# Patient Record
Sex: Male | Born: 1985 | Race: Black or African American | Hispanic: No | Marital: Single | Smoking: Never smoker
Health system: Southern US, Community
[De-identification: ages and names within clinical notes are randomized; demographics above are authoritative.]

## PROBLEM LIST (undated history)

## (undated) DIAGNOSIS — Q249 Congenital malformation of heart, unspecified: Secondary | ICD-10-CM

## (undated) HISTORY — PX: CARDIAC ELECTROPHYSIOLOGY MAPPING AND ABLATION: SHX1292

---

## 2009-02-28 ENCOUNTER — Emergency Department (HOSPITAL_COMMUNITY): Admission: EM | Admit: 2009-02-28 | Discharge: 2009-03-01 | Payer: Self-pay | Admitting: Emergency Medicine

## 2009-12-03 ENCOUNTER — Emergency Department (HOSPITAL_COMMUNITY): Admission: EM | Admit: 2009-12-03 | Discharge: 2009-12-03 | Payer: Self-pay | Admitting: Emergency Medicine

## 2011-07-10 ENCOUNTER — Emergency Department (HOSPITAL_COMMUNITY)
Admission: EM | Admit: 2011-07-10 | Discharge: 2011-07-11 | Disposition: A | Discharge status: Home / Self Care | Payer: BC Managed Care – PPO | Attending: Emergency Medicine | Admitting: Emergency Medicine

## 2011-07-10 DIAGNOSIS — R071 Chest pain on breathing: Secondary | ICD-10-CM | POA: Insufficient documentation

## 2011-07-10 DIAGNOSIS — R079 Chest pain, unspecified: Secondary | ICD-10-CM | POA: Insufficient documentation

## 2011-07-10 DIAGNOSIS — R0602 Shortness of breath: Secondary | ICD-10-CM | POA: Insufficient documentation

## 2011-07-10 DIAGNOSIS — N289 Disorder of kidney and ureter, unspecified: Secondary | ICD-10-CM

## 2011-07-10 DIAGNOSIS — R0789 Other chest pain: Secondary | ICD-10-CM

## 2011-07-10 HISTORY — DX: Congenital malformation of heart, unspecified: Q24.9

## 2011-07-10 NOTE — ED Notes (Signed)
Pt presents with NAD- left sided chest pain that started mid day- Pt is not constant increases with movement and does not change with breathing- Pt has HX of cardiac ablation.

## 2011-07-11 ENCOUNTER — Emergency Department (HOSPITAL_COMMUNITY): Payer: BC Managed Care – PPO

## 2011-07-11 LAB — BASIC METABOLIC PANEL
BUN: 14 mg/dL (ref 6–23)
CO2: 27 mEq/L (ref 19–32)
Calcium: 9 mg/dL (ref 8.4–10.5)
Chloride: 102 mEq/L (ref 96–112)
Chloride: 105 mEq/L (ref 96–112)
Potassium: 3.8 mEq/L (ref 3.5–5.1)
Sodium: 139 mEq/L (ref 135–145)

## 2011-07-11 LAB — CBC
Hemoglobin: 14.5 g/dL (ref 13.0–17.0)
MCH: 27.6 pg (ref 26.0–34.0)
MCHC: 32.7 g/dL (ref 30.0–36.0)
MCV: 84.6 fL (ref 78.0–100.0)
Platelets: 179 10*3/uL (ref 150–400)
RBC: 5.25 MIL/uL (ref 4.22–5.81)
RDW: 13.2 % (ref 11.5–15.5)

## 2011-07-11 LAB — CARDIAC PANEL(CRET KIN+CKTOT+MB+TROPI)
CK, MB: 2.1 ng/mL (ref 0.3–4.0)
Relative Index: 0.5 (ref 0.0–2.5)
Troponin I: <0.3 ng/mL (ref <0.30)

## 2011-07-11 LAB — CK: Total CK: 353 U/L — ABNORMAL HIGH (ref 7–232)

## 2011-07-11 MED ORDER — SODIUM CHLORIDE 0.9 % IV BOLUS (SEPSIS)
1000.0000 mL | Freq: Once | INTRAVENOUS | Status: AC
  Administered 2011-07-11: 1000 mL via INTRAVENOUS

## 2011-07-11 NOTE — ED Notes (Signed)
Liter bolus complete

## 2011-07-11 NOTE — ED Notes (Signed)
Labs redrawn and sent per rn

## 2011-07-11 NOTE — ED Notes (Signed)
Finished bolus of 1L of nacl

## 2011-07-11 NOTE — ED Notes (Signed)
Noted several bursts of st to 120 then with return to BL

## 2011-07-11 NOTE — ED Provider Notes (Signed)
History     CSN: [id number] Arrival date & time: 07/10/2011 11:55 PM   First MD Initiated Contact with Patient 07/11/11 0004      Chief Complaint  Patient presents with  . Chest Pain    (Consider location/radiation/quality/duration/timing/severity/associated sxs/prior treatment) HPI The patient presents with left sided chest pain. The pain began gradually approximately 12 hours ago. Since onset the pain has been more present than not, though it is waxing and waning. The pain is described as pressure-like. It is nonradiating. It is positional. It is not pleuritic. It is not exertional. No dyspnea, no lightheadedness, no nausea, no syncope. The patient notes that over the past 2-3 weeks he has had intermittent, brief episodes of lightheadedness, palpitations, mild nausea. The patient is a notable history of cardiac ablation as a teenager for an arrhythmia. He specifies that he did not have hypertrophic cardiomyopathy, and solely electrocardiographic abnormalities.  No current meds usage. Past Medical History  Diagnosis Date  . Heart abnormality     Past Surgical History  Procedure Date  . Cardiac electrophysiology mapping and ablation     History reviewed. No pertinent family history.  History  Substance Use Topics  . Smoking status: Never Smoker   . Smokeless tobacco: Not on file  . Alcohol Use: No      Review of Systems  All other systems reviewed and are negative.    Allergies  Review of patient's allergies indicates no known allergies.  Home Medications   Current Outpatient Rx  Name Route Sig Dispense Refill  . ASPIRIN 81 MG PO CHEW Oral Chew 81 mg by mouth daily.      Marland Kitchen DIMENHYDRINATE 50 MG PO TABS Oral Take 50 mg by mouth every 8 (eight) hours as needed. Dizziness lethargic feelings.     Marland Kitchen VITAMIN B-12 500 MCG PO TABS Oral Take 500 mcg by mouth daily.        BP 136/73  Pulse 67  Temp(Src) 98.4 F (36.9 C) (Oral)  Resp 18  SpO2 100%  Physical Exam    Constitutional: He is oriented to person, place, and time. He appears well-developed and well-nourished.  HENT:  Head: Normocephalic and atraumatic.  Eyes: Conjunctivae are normal. Pupils are equal, round, and reactive to light.  Neck: Neck supple.  Cardiovascular: Normal rate, regular rhythm, normal heart sounds and intact distal pulses.  Exam reveals no gallop and no friction rub.   No murmur heard. Pulses:      Radial pulses are 2+ on the right side, and 2+ on the left side.       Posterior tibial pulses are 2+ on the right side, and 2+ on the left side.  Pulmonary/Chest: No respiratory distress.  Abdominal: Soft. There is no tenderness.  Musculoskeletal: He exhibits no edema.  Neurological: He is alert and oriented to person, place, and time.  Skin: Skin is warm and dry.  Psychiatric: He has a normal mood and affect.    ED Course  Procedures (including critical care time)   Labs Reviewed  CBC  CARDIAC PANEL(CRET KIN+CKTOT+MB+TROPI)  BASIC METABOLIC PANEL   No results found.   No diagnosis found.    MDM  This previously well 25 year old male presents with one day of chest discomfort, which began following several weeks of intermittent, transient complaints. On exam the patient is in no distress. The patient's history of arrhythmia requiring intubation he is suggestive of dysrhythmia as possible etiology. The patient's ECG does not demonstrate dysrhythmia though there is  evidence of left ventricular hypertrophy. The absence of cardiomegaly on x-ray, as well as the patient's denial of prior cardiomyopathy in the absence of any syncope during this illness his reassuring for the very low probability of Cardiomyopathic disease. The patient's labs were notable for an elevated CK as well as an elevated creatinine. The initial labs were discussed with the patient who acknowledged using muscle building supplements in the distant past, including creatine.  Although this is a possible  cause of his lab abnormalities, or some concern for ongoing renal dysfunction.  The patient was resuscitated with IV fluids, and repeat labs demonstrated an improvement numerically. Discussion was held with the patient and his wife regarding the necessity for continued outpatient evaluation of these abnormalities. Return precautions were provided to the patient.      Gerhard Munch, MD 07/11/11 [phone]

## 2011-07-11 NOTE — ED Notes (Signed)
sts that all the CP /"strange feeling" has resolved

## 2011-07-19 ENCOUNTER — Encounter (HOSPITAL_COMMUNITY): Payer: Self-pay | Admitting: *Deleted

## 2011-07-19 ENCOUNTER — Emergency Department (HOSPITAL_COMMUNITY)
Admission: EM | Admit: 2011-07-19 | Discharge: 2011-07-20 | Disposition: A | Discharge status: Home / Self Care | Payer: BC Managed Care – PPO | Attending: Emergency Medicine | Admitting: Emergency Medicine

## 2011-07-19 ENCOUNTER — Emergency Department (HOSPITAL_COMMUNITY): Payer: BC Managed Care – PPO

## 2011-07-19 ENCOUNTER — Other Ambulatory Visit: Payer: Self-pay

## 2011-07-19 DIAGNOSIS — R079 Chest pain, unspecified: Secondary | ICD-10-CM | POA: Insufficient documentation

## 2011-07-19 DIAGNOSIS — R209 Unspecified disturbances of skin sensation: Secondary | ICD-10-CM | POA: Insufficient documentation

## 2011-07-19 DIAGNOSIS — R0602 Shortness of breath: Secondary | ICD-10-CM | POA: Insufficient documentation

## 2011-07-19 DIAGNOSIS — R0789 Other chest pain: Secondary | ICD-10-CM

## 2011-07-20 NOTE — ED Provider Notes (Signed)
History     CSN: [id number] Arrival date & time: 07/19/2011  7:57 PM   First MD Initiated Contact with Patient 07/20/11 0053      Chief Complaint  Patient presents with  . Chest Pain    pt c/o chest-ache, reports feeling anxious/nervous. states pain started today. and c/o left arm numbness.     (Consider location/radiation/quality/duration/timing/severity/associated sxs/prior treatment) HPI Comments: The patient presents with left sided chest pain that radiated to L arm for a short period  The pain began gradually approximately 6 hours ago. Since onset the pain has resolved. . The pain is described as pressure-like.  It is positional. It is not pleuritic. It is not exertional. No dyspnea, no lightheadedness, no nausea, no syncope. The patient notes that over the past 2-3 weeks he has had intermittent, brief episodes of lightheadedness, palpitations, mild nausea. The patient is a notable history of cardiac ablation as a teenager for an arrhythmia. He specifies that he did not have hypertrophic cardiomyopathy, and solely electrocardiographic abnormalities.  No current meds usage. Reports that today.  He did make an appointment with cardiology for further evaluation   Patient is a 25 y.o. male presenting with chest pain. The history is provided by the patient.  Chest Pain Chest pain occurs intermittently. The chest pain is resolved. The quality of the pain is described as aching. Pertinent negatives for primary symptoms include no palpitations.     Past Medical History  Diagnosis Date  . Heart abnormality a- fib    Past Surgical History  Procedure Date  . Cardiac electrophysiology mapping and ablation     History reviewed. No pertinent family history.  History  Substance Use Topics  . Smoking status: Never Smoker   . Smokeless tobacco: Not on file  . Alcohol Use: Yes     rarely      Review of Systems  Constitutional: Negative.   HENT: Negative.   Respiratory:  Negative for chest tightness.   Cardiovascular: Positive for chest pain. Negative for palpitations.  Gastrointestinal: Negative.   Genitourinary: Negative.   Musculoskeletal: Negative.   Neurological: Negative.   Hematological: Negative.   Psychiatric/Behavioral: Negative.     Allergies  Review of patient's allergies indicates no known allergies.  Home Medications   Current Outpatient Rx  Name Route Sig Dispense Refill  . ASPIRIN 81 MG PO CHEW Oral Chew 81 mg by mouth daily.      Marland Kitchen DIMENHYDRINATE 50 MG PO TABS Oral Take 50 mg by mouth every 8 (eight) hours as needed. Dizziness lethargic feelings.     Marland Kitchen VITAMIN B-12 500 MCG PO TABS Oral Take 500 mcg by mouth daily.        BP 133/93  Pulse 80  Temp(Src) 98.1 F (36.7 C) (Oral)  Resp 20  Wt 206 lb (93.441 kg)  SpO2 100%  Physical Exam  Constitutional: He is oriented to person, place, and time. He appears well-developed and well-nourished.  HENT:  Head: Normocephalic.  Eyes: Pupils are equal, round, and reactive to light.  Neck: Normal range of motion.  Cardiovascular: Normal rate.   Pulmonary/Chest: Effort normal.  Abdominal: Soft.  Musculoskeletal: Normal range of motion.  Neurological: He is oriented to person, place, and time.  Skin: Skin is warm.  Psychiatric: He has a normal mood and affect.    ED Course  Procedures (including critical care time)  Labs Reviewed - No data to display Dg Chest 2 View  07/19/2011  *RADIOLOGY REPORT*  Clinical Data: Chest  pain, shortness of breath, left arm numbness  CHEST - 2 VIEW  Comparison: 07/11/2011  Findings: Mild hyperinflation. The heart size and pulmonary vascularity are normal. The lungs appear clear and expanded without focal air space disease or consolidation. No blunting of the costophrenic angles. No pneumothorax.  No significant change since previous study.  IMPRESSION: No evidence of active pulmonary disease.  Original Report Authenticated By: Marlon Pel, M.D.     ED ECG REPORT   Date: 07/20/2011  EKG Time: 3:55 AM  Rate: 82  Rhythm: normal sinus rhythm,  normal EKG, normal sinus rhythm, unchanged from previous tracings  Axis: normal  Intervals:none  ST&T Change: none  Narrative Interpretation: normal            1. Chest pain radiating to arm       MDM  Chest pain unspecified        Arman Filter, NP 07/20/11 0355  Arman Filter, NP 07/20/11 [phone]

## 2011-07-20 NOTE — ED Provider Notes (Signed)
Medical screening examination/treatment/procedure(s) were performed by non-physician practitioner and as supervising physician I was immediately available for consultation/collaboration.   Lyanne Co, MD 07/20/11 [phone]

## 2011-07-20 NOTE — ED Notes (Signed)
Family at bs.

## 2013-06-17 IMAGING — CR DG CHEST 2V
2 series · 2 of 2 positions shown · non-contrast
Comparison: Chest radiograph performed 12/03/2009

CLINICAL DATA: Left-sided chest pain and shortness of breath;
syncope.

CHEST - 2 VIEW

[w chest pa]
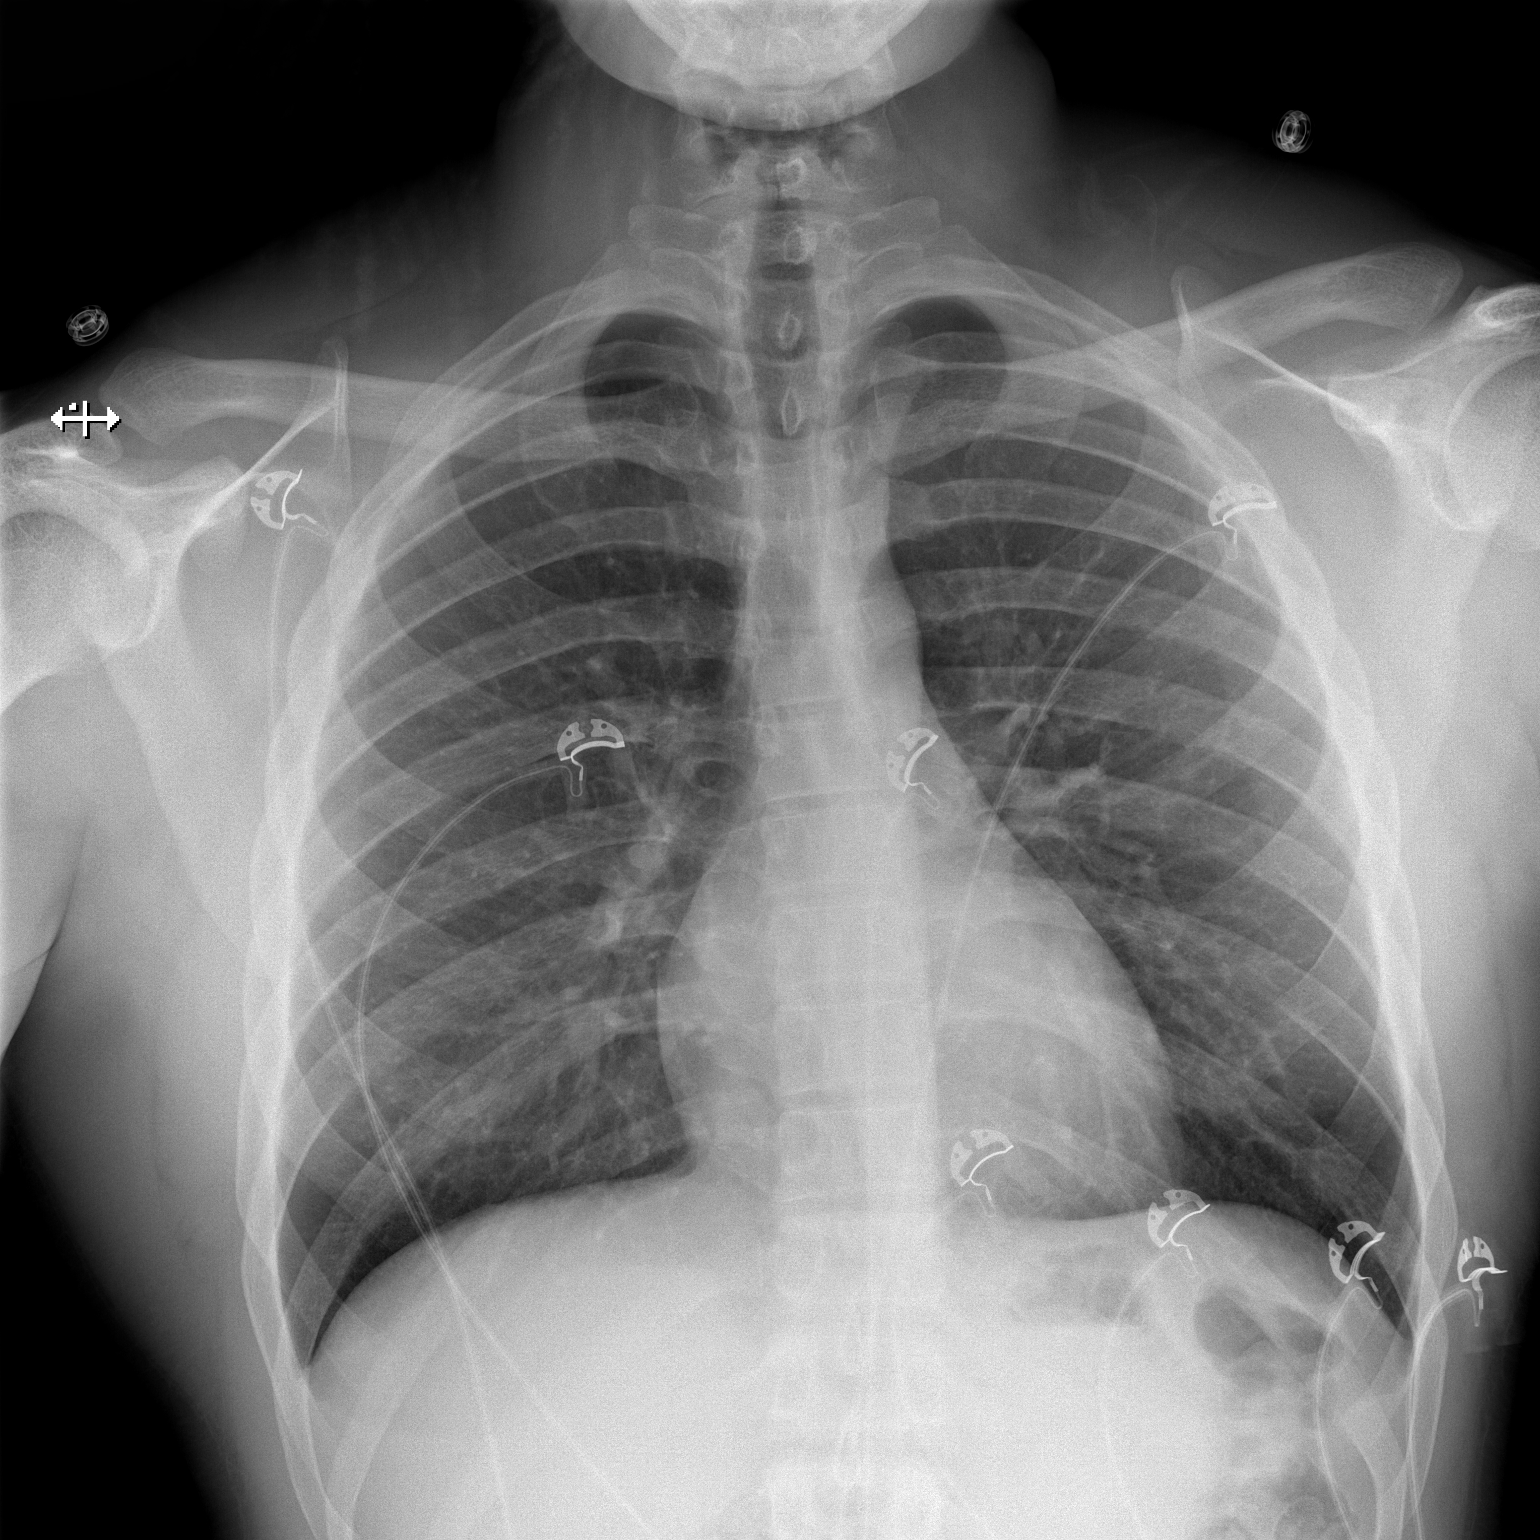

[w chest lat]
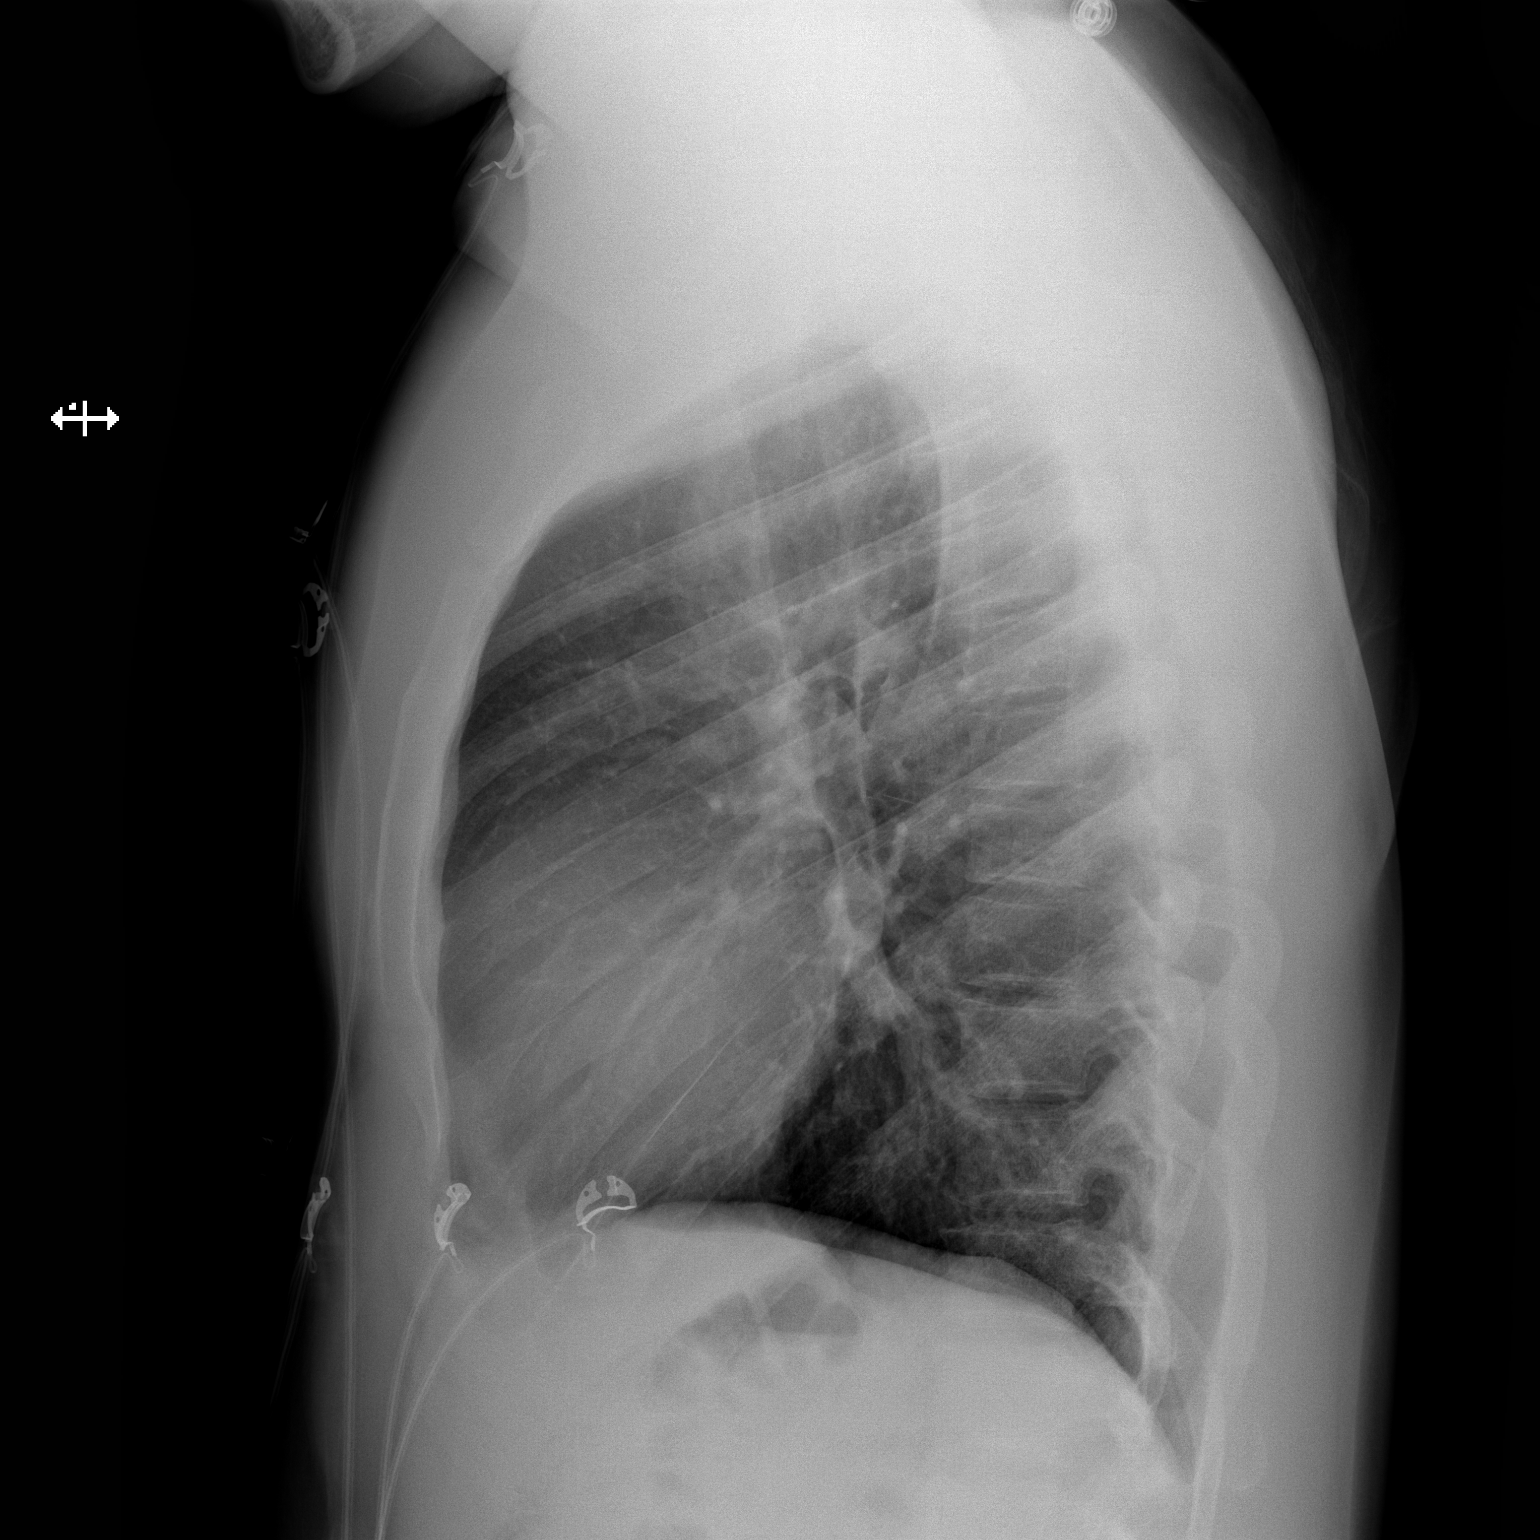

[2 of 2 positions shown; findings below may reference images not displayed]

FINDINGS: The lungs are well-aerated and clear.  There is no
evidence of focal opacification, pleural effusion or pneumothorax.

The heart is normal in size; the mediastinal contour is within
normal limits.  No acute osseous abnormalities are seen.
IMPRESSION: No acute cardiopulmonary process seen.

## 2013-06-25 IMAGING — CR DG CHEST 2V
2 series · 2 of 2 positions shown · non-contrast
Comparison: 07/11/2011

CLINICAL DATA: Chest pain, shortness of breath, left arm numbness

CHEST - 2 VIEW

[w chest pa]
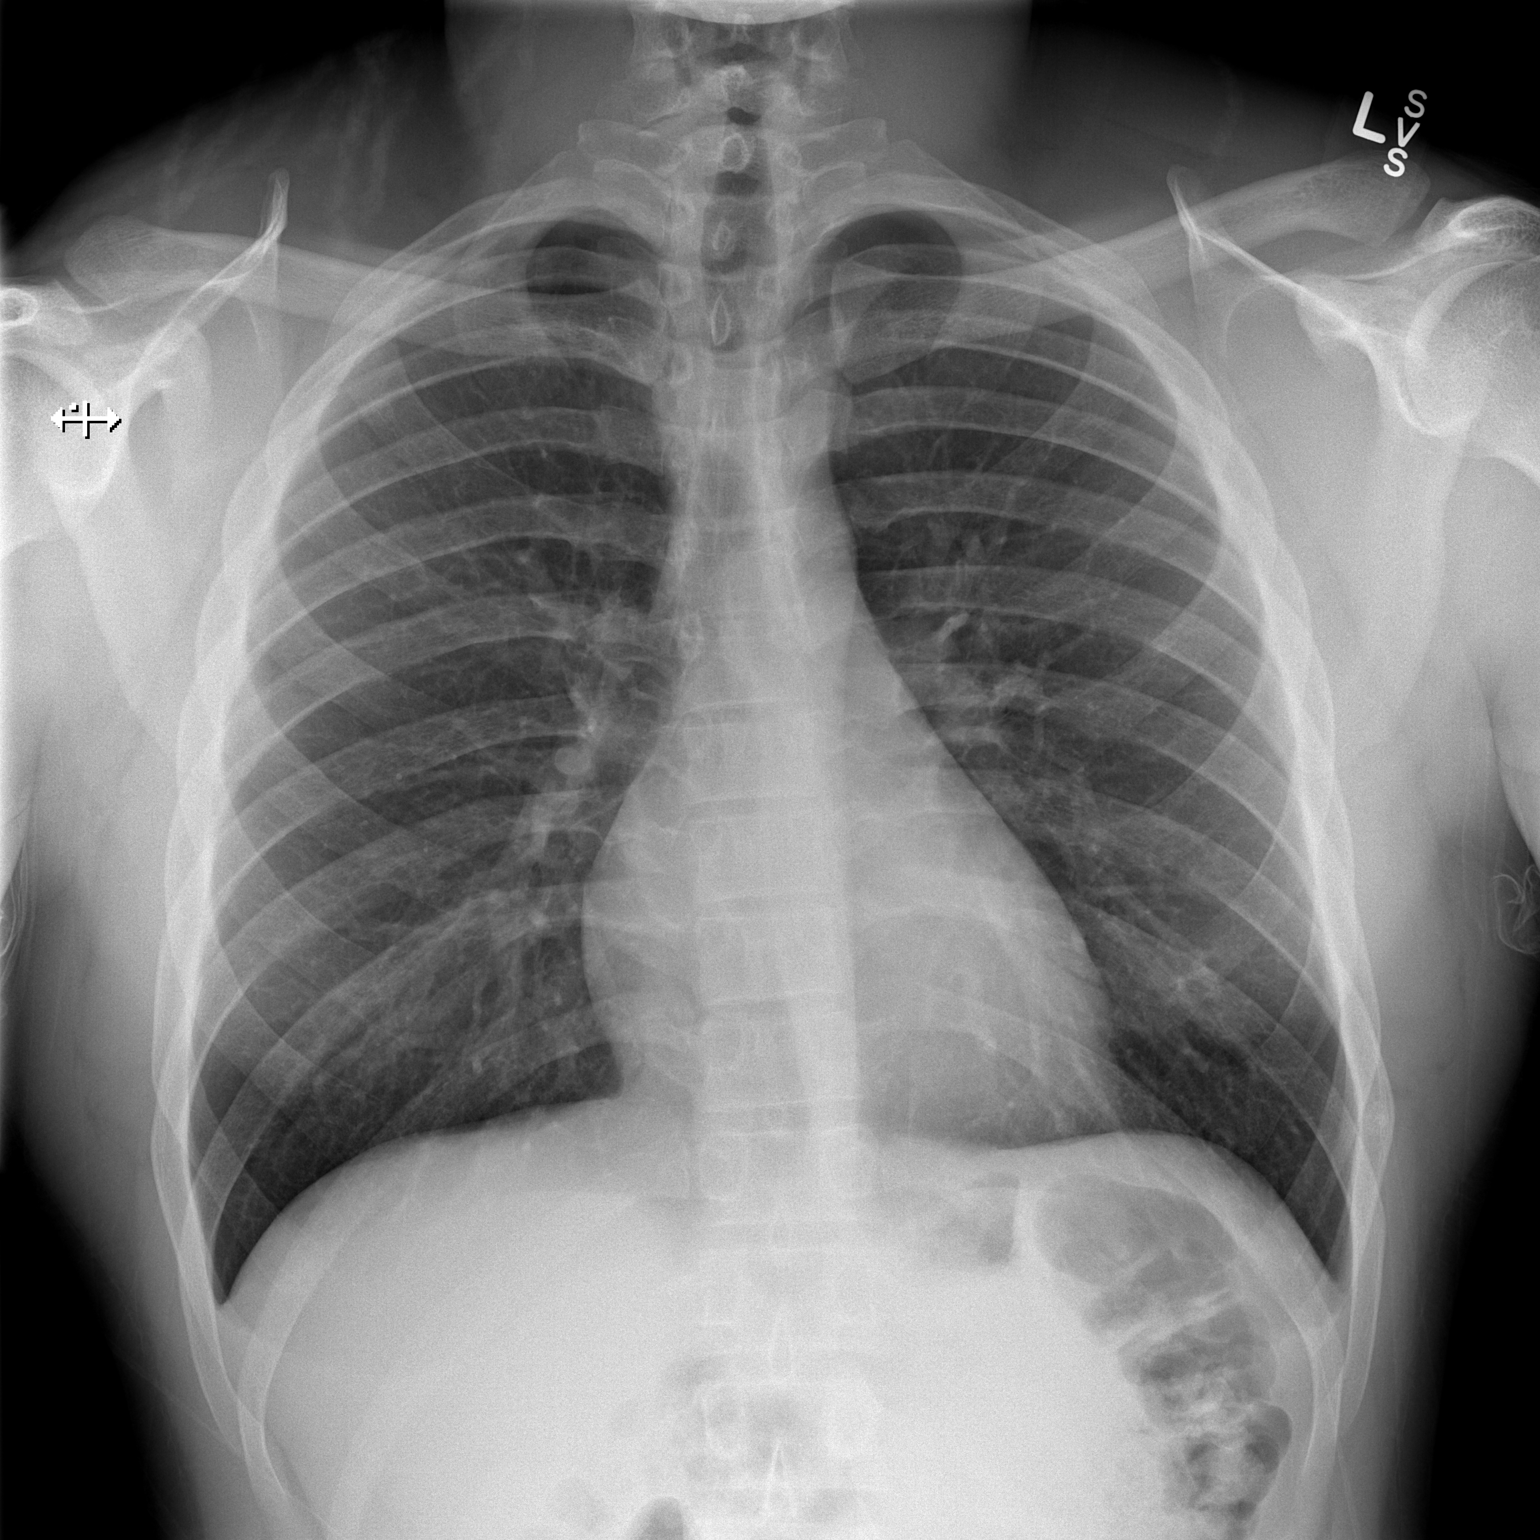

[w chest lat]
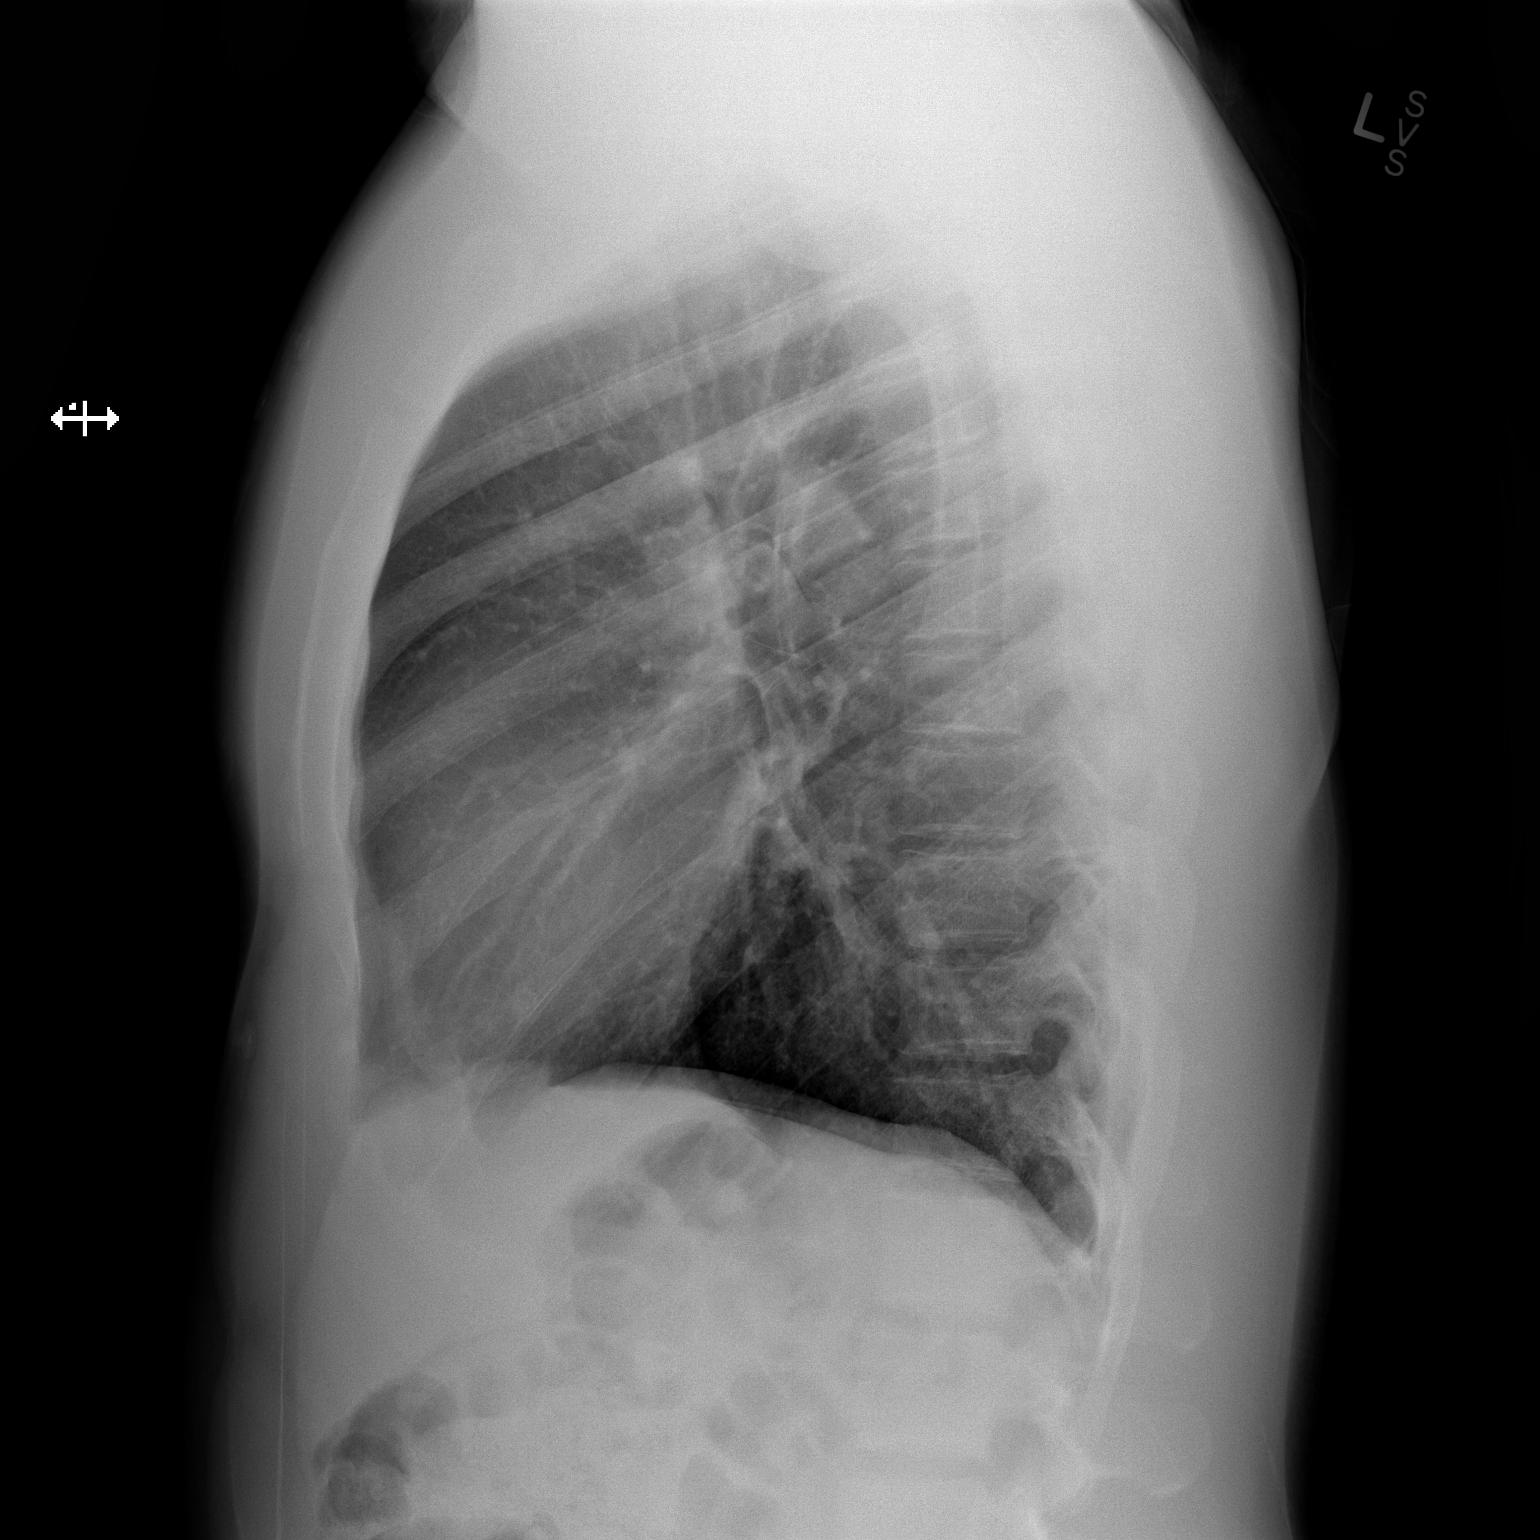

[2 of 2 positions shown; findings below may reference images not displayed]

FINDINGS: Mild hyperinflation. The heart size and pulmonary
vascularity are normal. The lungs appear clear and expanded without
focal air space disease or consolidation. No blunting of the
costophrenic angles. No pneumothorax.  No significant change since
previous study.
IMPRESSION: No evidence of active pulmonary disease.

## 2015-03-05 ENCOUNTER — Ambulatory Visit (INDEPENDENT_AMBULATORY_CARE_PROVIDER_SITE_OTHER): Payer: Commercial Managed Care - HMO | Admitting: Urgent Care

## 2015-03-05 VITALS — BP 122/72 | HR 81 | Temp 98.7°F | Resp 17 | Ht 68.5 in | Wt 215.0 lb

## 2015-03-05 DIAGNOSIS — S61219A Laceration without foreign body of unspecified finger without damage to nail, initial encounter: Secondary | ICD-10-CM | POA: Diagnosis not present

## 2015-03-05 DIAGNOSIS — Z23 Encounter for immunization: Secondary | ICD-10-CM

## 2015-03-05 NOTE — Patient Instructions (Signed)

## 2015-03-05 NOTE — Progress Notes (Signed)
    MRN: [id number] DOB: [id number]  Subjective:   Jon Dorsey is a 29 y.o. male presenting for chief complaint of Finger Injury  Reports right ring finger laceration sustained from his car door today. Patient cleaned it with soap and water, covered it with a band-aid and came in for laceration care. Admits tenderness, minimal amount of bleeding, some swelling. Denies loss of ROM, weakness, bony deformity, foreign body sensation. Does not recall last TDAP. Denies any other aggravating or relieving factors, no other questions or concerns.  Deaaron has a current medication list which includes the following prescription(s): aspirin, dimenhydrinate, and vitamin b-12. He has No Known Allergies.  Aboubacar  has a past medical history of Heart abnormality (a- fib). Also  has past surgical history that includes Cardiac electrophysiology mapping and ablation.  ROS As in subjective.  Objective:   Vitals: BP 122/72 mmHg  Pulse 81  Temp(Src) 98.7 F (37.1 C) (Oral)  Resp 17  Ht 5' 8.5" (1.74 m)  Wt 215 lb (97.523 kg)  BMI 32.21 kg/m2  SpO2 98%  Physical Exam  Constitutional: He is oriented to person, place, and time. He appears well-developed and well-nourished.  Cardiovascular: Normal rate.   Pulmonary/Chest: Effort normal.  Musculoskeletal:       Hands: Neurological: He is alert and oriented to person, place, and time.  Skin: Skin is warm and dry. No rash noted. No erythema. No pallor.   PROCEDURE NOTE: laceration repair Verbal consent obtained from patient.  Local anesthesia with 1cc Lidocaine 1% without epinephrine.  Wound explored for tendon, ligament damage. Wound scrubbed with soap and water and rinsed. Wound closed with #2 4-0 Prolene simple interrupted sutures.  Wound cleansed and dressed.  Assessment and Plan :   1. Finger laceration, initial encounter - Stable, wound care instruction provided and advised to protect right hand while at work. RTC in 10-14 days for  suture removal or sooner if signs and symptoms of infection develop.  Wallis Bamberg, PA-C [facility] [phone] 03/05/2015 9:26 AM

## 2015-03-14 ENCOUNTER — Ambulatory Visit: Payer: BC Managed Care – PPO | Admitting: Cardiology
